# Patient Record
Sex: Female | Born: 1950 | Race: White | Hispanic: No | Marital: Married | Smoking: Never smoker
Health system: Southern US, Community
[De-identification: ages and names within clinical notes are randomized; demographics above are authoritative.]

## PROBLEM LIST (undated history)

## (undated) DIAGNOSIS — I10 Essential (primary) hypertension: Secondary | ICD-10-CM

---

## 2018-02-13 ENCOUNTER — Emergency Department (INDEPENDENT_AMBULATORY_CARE_PROVIDER_SITE_OTHER): Payer: Commercial Managed Care - PPO

## 2018-02-13 ENCOUNTER — Encounter: Payer: Self-pay | Admitting: Emergency Medicine

## 2018-02-13 ENCOUNTER — Emergency Department
Admission: EM | Admit: 2018-02-13 | Discharge: 2018-02-13 | Disposition: A | Discharge status: Home / Self Care | Payer: Commercial Managed Care - PPO | Source: Home / Self Care | Attending: Family Medicine | Admitting: Family Medicine

## 2018-02-13 DIAGNOSIS — M1711 Unilateral primary osteoarthritis, right knee: Secondary | ICD-10-CM

## 2018-02-13 HISTORY — DX: Essential (primary) hypertension: I10

## 2018-02-13 NOTE — Discharge Instructions (Addendum)
Wear a knee sleeve or ace wrap for extra support.  Apply ice pack for 20 to 30 minutes, 3 to 4 times daily  Continue until pain and swelling decrease.  May take Ibuprofen 200mg , 4 tabs every 8 hours with food.  Begin knee exercises as tolerated   Exercise Straight Leg Raises - Quadriceps Lie on your back with your left / right leg extended and your other knee bent. Tense the muscles in the front of your left / right thigh. You should see your kneecap slide up or see increased dimpling just above the knee. Your thigh may even shake a bit. Keep these muscles tight as you raise your leg 4-6 inches (10-15 cm) off the floor. Do not let your knee bend. Hold this position for 10 seconds. Keep these muscles tense as you lower your leg. Relax your muscles slowly and completely after each repetition. Repeat 5 times. Complete this exercise 2 times a day.

## 2018-02-13 NOTE — ED Provider Notes (Signed)
[facility]    CSN: [id number] Arrival date & time: 02/13/18  1636     History   Chief Complaint Chief Complaint  Patient presents with  . Knee Pain    HPI Cheyenne Franklin is a 67 y.o. female.   Patient complains of increased right knee pain with weight bearing after walking more than usual four days ago.  She recalls no injury.  She complains of fullness in her popliteal fossa.  She has had mild improvement with ibuprofen 400mg   The history is provided by the patient.  Knee Pain  Location:  Knee Time since incident:  4 days Injury: no   Knee location:  R knee Pain details:    Quality:  Aching   Radiates to:  Does not radiate   Severity:  Mild   Onset quality:  Gradual   Duration:  4 days   Timing:  Constant   Progression:  Unchanged Chronicity:  New Prior injury to area:  No Relieved by:  Nothing Worsened by:  Activity and bearing weight Ineffective treatments:  NSAIDs Associated symptoms: no decreased ROM, no muscle weakness, no numbness, no stiffness, no swelling and no tingling   Risk factors: obesity     Past Medical History:  Diagnosis Date  . Hypertension     Active problems:  Essential hypertension; left breast CA monitoring   History reviewed. Pertinent surgical history:  Left breast CA s/p lumpectomy and radiation therapy.  OB History   None      Home Medications    Prior to Admission medications   Amlodipine; metoprolol    Family History History reviewed. COPD, DM, Hypertension  Social History Social History   Tobacco Use  . Smoking status: Never Smoker  . Smokeless tobacco: Never Used  Substance Use Topics  . Alcohol use: Not on file  . Drug use: Not on file     Allergies   Penicillins   Review of Systems Review of Systems  Musculoskeletal: Negative for stiffness.  All other systems reviewed and are negative.    Physical Exam Triage Vital Signs ED Triage Vitals  Enc Vitals Group     BP      Pulse       Resp      Temp      Temp src      SpO2      Weight      Height      Head Circumference      Peak Flow      Pain Score      Pain Loc      Pain Edu?      Excl. in GC?    No data found.  Updated Vital Signs BP (!) 145/81 (BP Location: Right Arm)   Pulse 86   Temp 98.1 F (36.7 C) (Oral)   Wt 108.9 kg   SpO2 97%   Visual Acuity Right Eye Distance:   Left Eye Distance:   Bilateral Distance:    Right Eye Near:   Left Eye Near:    Bilateral Near:     Physical Exam  Constitutional: She appears well-developed and well-nourished. No distress.  HENT:  Head: Normocephalic.  Eyes: Pupils are equal, round, and reactive to light.  Cardiovascular: Normal rate.  Pulmonary/Chest: Effort normal.  Musculoskeletal: She exhibits no edema.       Right knee: She exhibits normal range of motion, no swelling, no effusion, no ecchymosis, no deformity, no laceration, no erythema, normal alignment  and normal meniscus. Tenderness found. Medial joint line tenderness noted. No patellar tendon tenderness noted.       Legs: Right knee:  No effusion, erythema, or warmth.  Knee stable, negative drawer test.  McMurray test negative.  Mild tenderness to palpation over MCL and medial joint line.  No posterior tenderness to palpation.  Neurological: She is alert.  Skin: Skin is warm and dry. No rash noted.  Nursing note and vitals reviewed.    UC Treatments / Results  Labs (all labs ordered are listed, but only abnormal results are displayed) Labs Reviewed - No data to display  EKG None  Radiology Dg Knee Complete 4 Views Right  Result Date: 02/13/2018 CLINICAL DATA:  Right knee pain with weight-bearing for 5 days EXAM: RIGHT KNEE - COMPLETE 4+ VIEW COMPARISON:  None. FINDINGS: No acute fracture or dislocation. Generalized osteopenia. No aggressive osseous lesion. Moderate medial femorotibial compartment joint space narrowing with marginal osteophytes. Tricompartmental marginal osteophytes.  Severe patellofemoral compartment joint space narrowing. No joint effusion. IMPRESSION: 1.  No acute osseous injury of the right knee. 2. Severe patellofemoral compartment osteoarthritis. Moderate right medial femorotibial compartment osteoarthritis. Electronically Signed   By: Elige KoHetal  Patel   On: 02/13/2018 17:41    Procedures Procedures (including critical care time)  Medications Ordered in UC Medications - No data to display  Initial Impression / Assessment and Plan / UC Course  I have reviewed the triage vital signs and the nursing notes.  Pertinent labs & imaging results that were available during my care of the patient were reviewed by me and considered in my medical decision making (see chart for details).    Note generalized osteopenia on x-ray. Dispensed knee sleeve.  Followup with Dr. Rodney Langtonhomas Thekkekandam or Dr. Clementeen GrahamEvan Corey ([facility]) if not improving about two weeks.  Re[facility], FNP [facility] for evaluation of osteopenia.   Final Clinical Impressions(s) / UC Diagnoses   Final diagnoses:  Primary osteoarthritis of right knee     Discharge Instructions     Wear a knee sleeve or ace wrap for extra support.  Apply ice pack for 20 to 30 minutes, 3 to 4 times daily  Continue until pain and swelling decrease.  May take Ibuprofen 200mg , 4 tabs every 8 hours with food.  Begin knee exercises as tolerated   Exercise Straight Leg Raises - Quadriceps 1. Lie on your back with your left / right leg extended and your other knee bent. 2. Tense the muscles in the front of your left / right thigh. You should see your kneecap slide up or see increased dimpling just above the knee. Your thigh may even shake a bit. 3. Keep these muscles tight as you raise your leg 4-6 inches (10-15 cm) off the floor. Do not let your knee bend. 4. Hold this position for 10 seconds. 5. Keep these muscles tense as you lower your leg. 6. Relax your muscles  slowly and completely after each repetition. Repeat 5 times. Complete this exercise 2 times a day.    ED Prescriptions    None          Lattie HawBeese, Stephen A, MD 02/13/18 1827

## 2018-02-13 NOTE — ED Triage Notes (Signed)
Pt c/o pain behind right knee x3 days. Denies injury but states kx of arthritis. Taking ibuprofen.

## 2019-11-13 IMAGING — DX DG KNEE COMPLETE 4+V*R*
4 series · 4 of 4 positions shown · non-contrast
Comparison: None.

CLINICAL DATA: Right knee pain with weight-bearing for 5 days

EXAM:
RIGHT KNEE - COMPLETE 4+ VIEW

[knee ap]
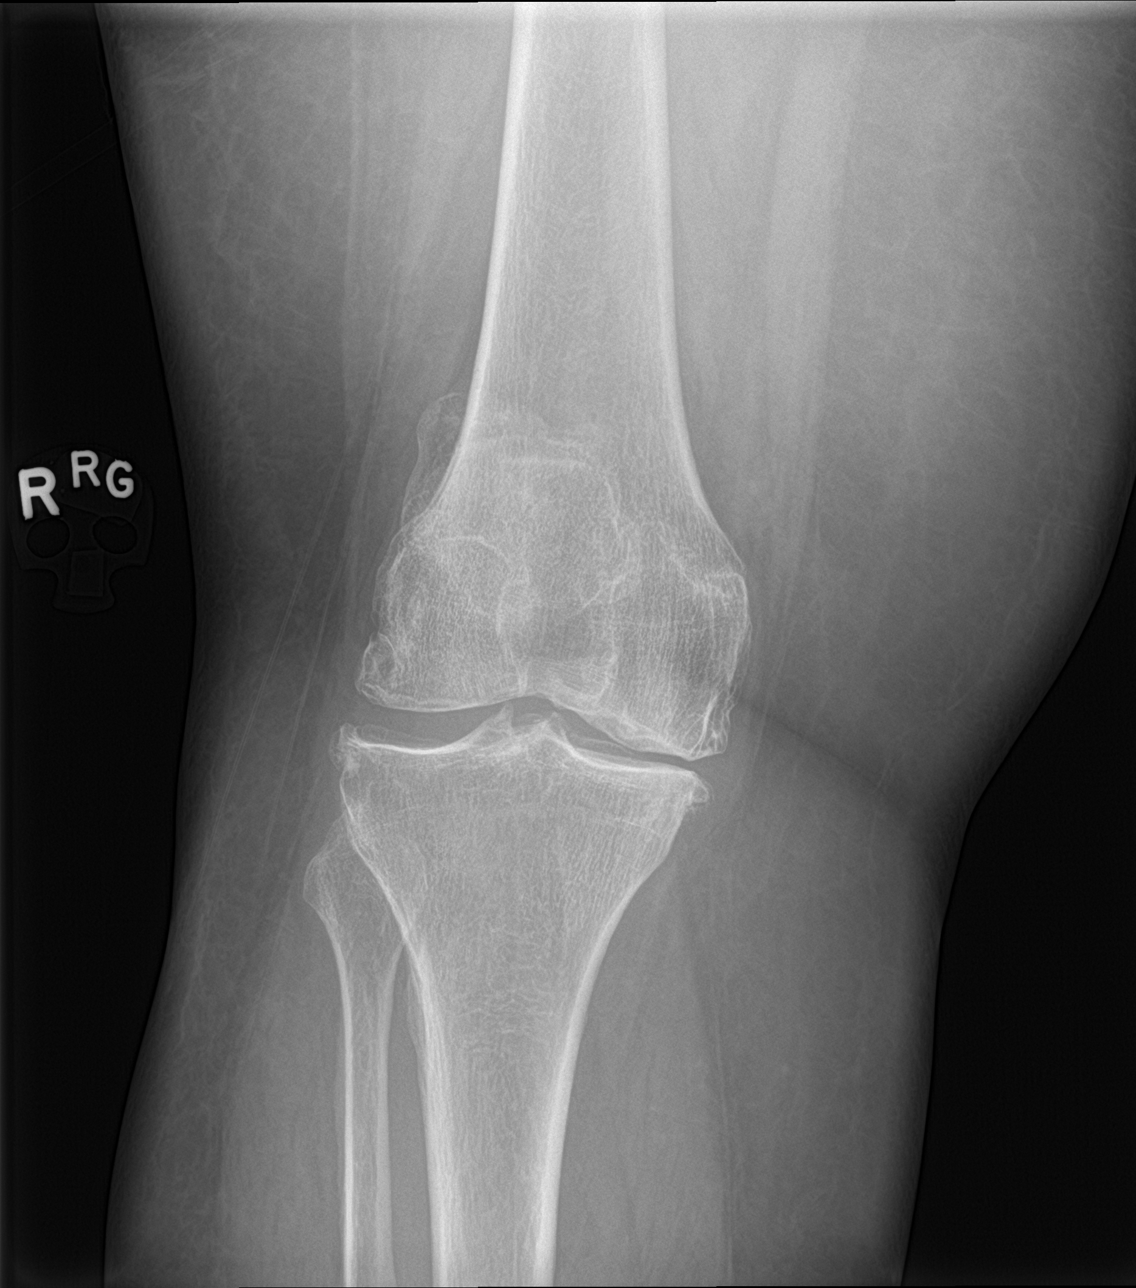

[knee lat]
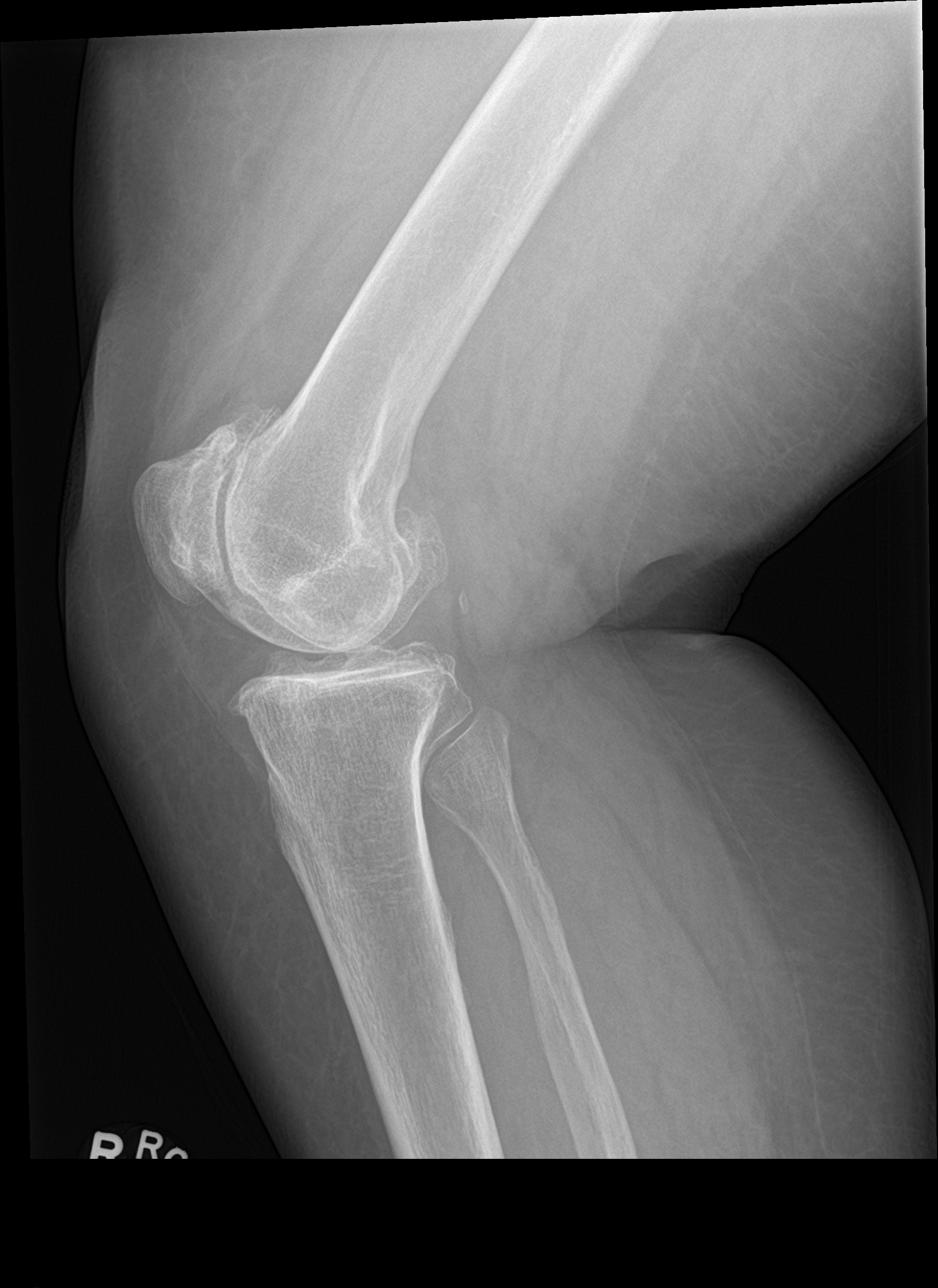

[knee obl (1 of 2)]
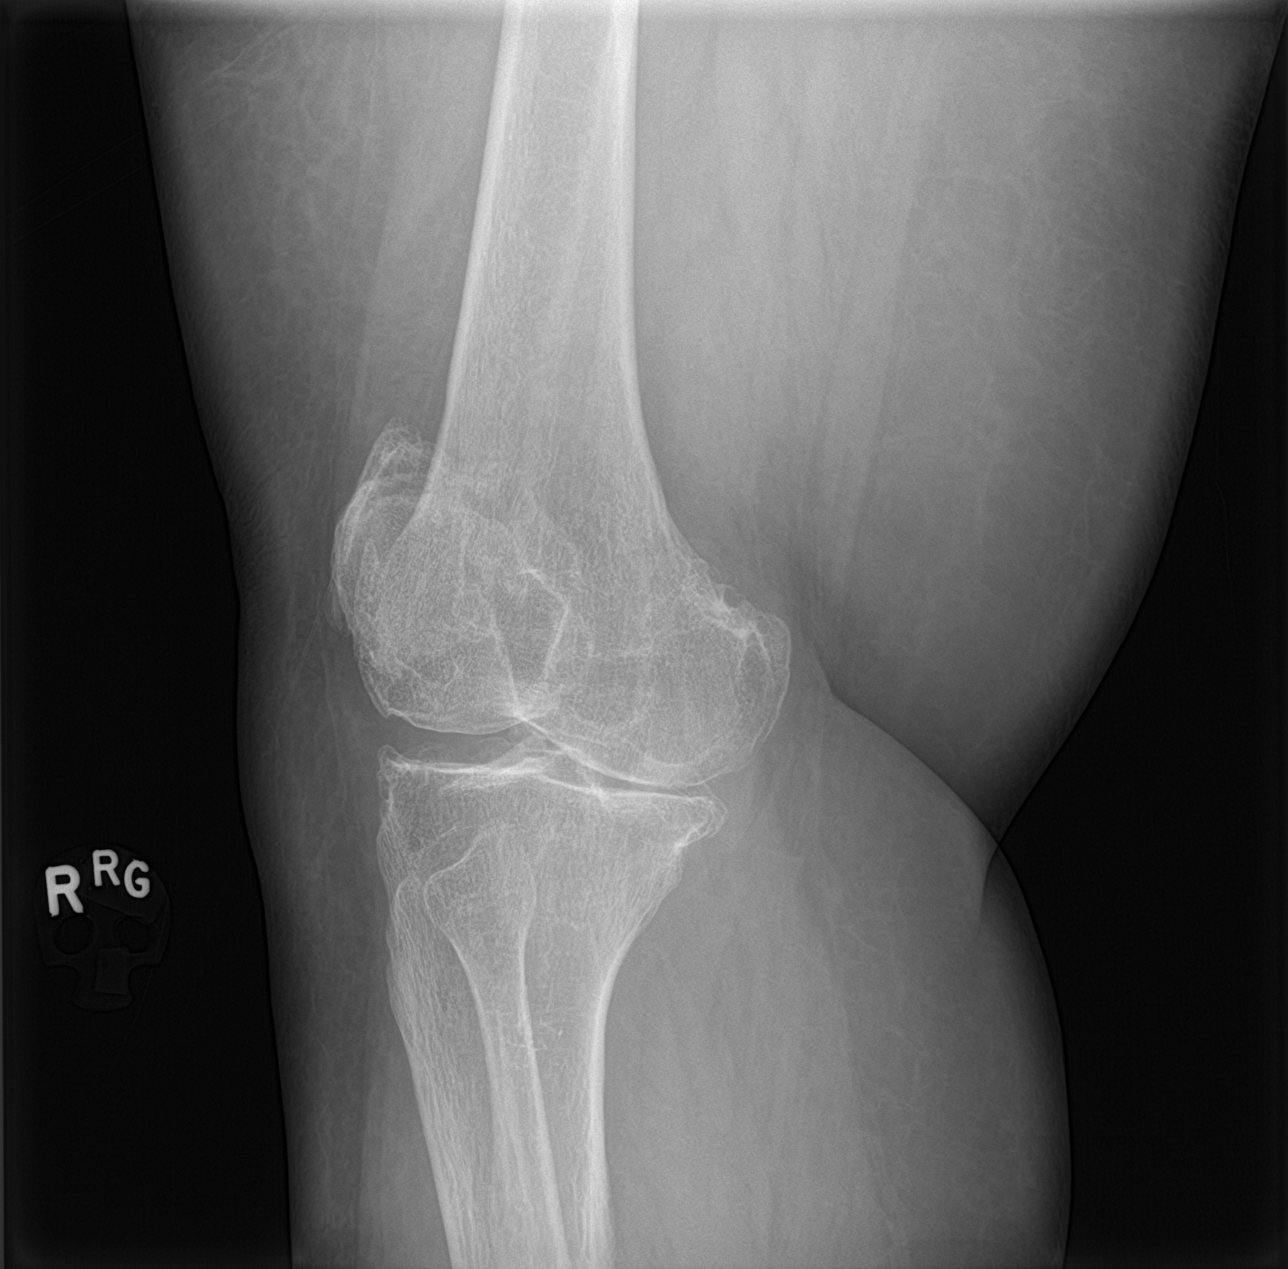

[knee obl (2 of 2)]
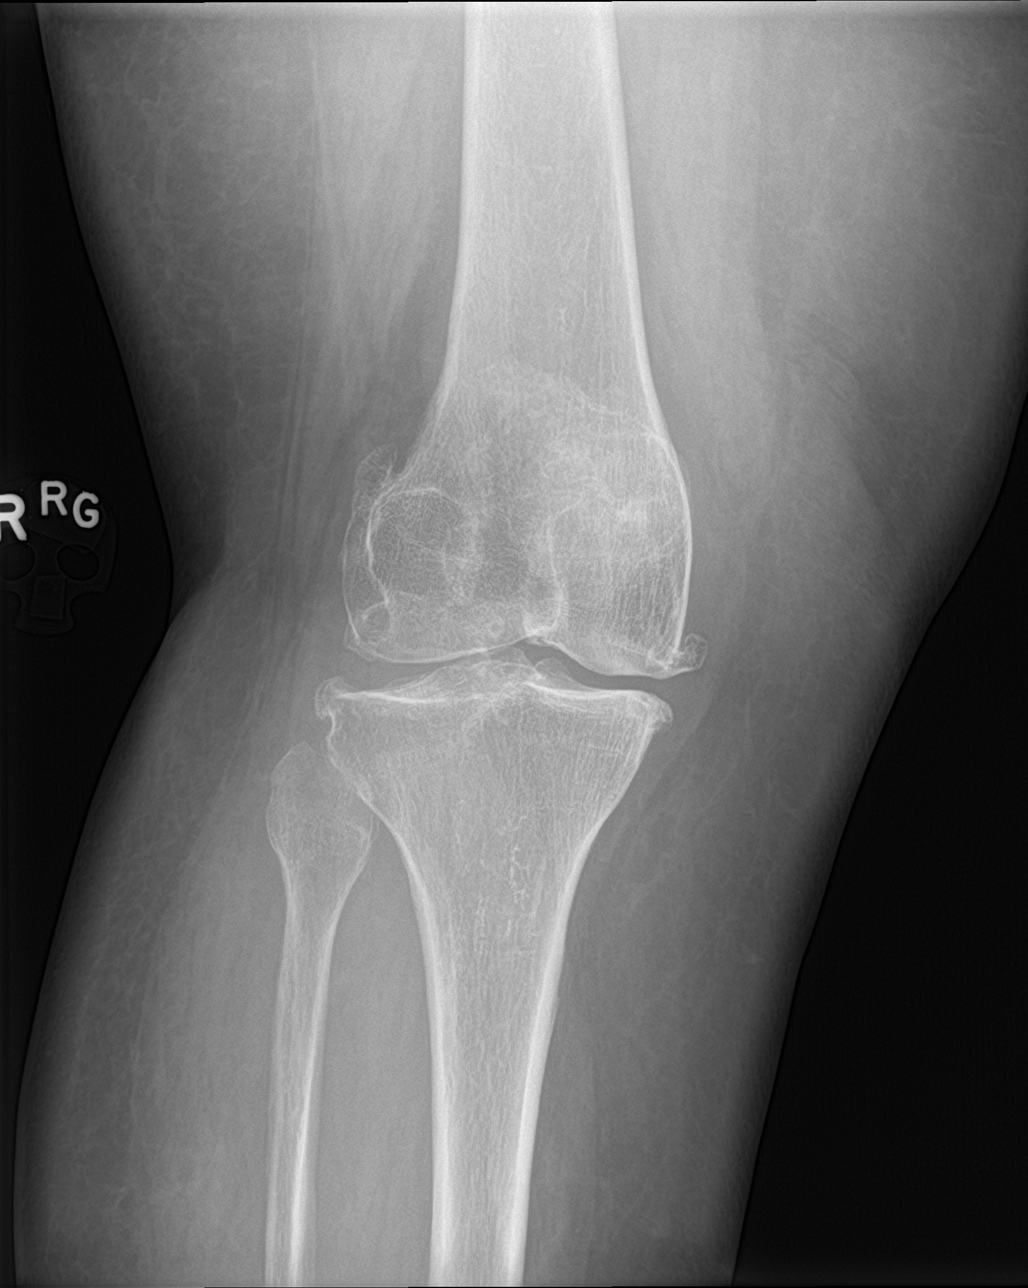

[4 of 4 positions shown; findings below may reference images not displayed]

FINDINGS: No acute fracture or dislocation. Generalized osteopenia. No
aggressive osseous lesion. Moderate medial femorotibial compartment
joint space narrowing with marginal osteophytes. Tricompartmental
marginal osteophytes. Severe patellofemoral compartment joint space
narrowing. No joint effusion.
IMPRESSION: 1.  No acute osseous injury of the right knee.
2. Severe patellofemoral compartment osteoarthritis. Moderate right
medial femorotibial compartment osteoarthritis.
